# Patient Record
Sex: Male | Born: 1965 | Race: White | Hispanic: No | Marital: Single | Smoking: Never smoker
Health system: Southern US, Community
[De-identification: ages and names within clinical notes are randomized; demographics above are authoritative.]

## PROBLEM LIST (undated history)

## (undated) HISTORY — PX: NO PAST SURGERIES: SHX2092

---

## 2003-12-18 ENCOUNTER — Encounter: Admission: RE | Admit: 2003-12-18 | Discharge: 2003-12-18 | Payer: Self-pay | Admitting: Family Medicine

## 2004-10-17 ENCOUNTER — Ambulatory Visit: Payer: Self-pay | Admitting: Family Medicine

## 2004-11-12 ENCOUNTER — Emergency Department (HOSPITAL_COMMUNITY): Admission: EM | Admit: 2004-11-12 | Discharge: 2004-11-12 | Payer: Self-pay

## 2005-07-11 IMAGING — US US ABDOMEN COMPLETE
1 series · 14 of 25 positions shown · non-contrast
Comparison: none

CLINICAL DATA: Abdominal pain for several weeks.  
 COMPLETE ABDOMINAL ULTRASOUND
 Multiple scans of the entire abdomen show the gallbladder to be normal.  The common bile duct is normal and measures 2.8 mm in maximum diameter.  The liver appears normal.  The pancreas is not well seen due to overlying gas.  The spleen is normal and measures 11.5 cm.  The right kidney is normal and measures 12.5 cm.  The left kidney is normal and measures 12.3 cm.  The abdominal aorta is normal and measures 1.5 cm.  The inferior vena cava appears to be within the normal limit.
 There is no free fluid within the abdomen or the pleural spaces.  
 IMPRESSION
 Normal complete abdominal ultrasound with poor visualization of the pancreas due to overlying gas.

[Series 1: unknown · 0.32mm/px · 14 of 62 slices shown]
[im 1/62]
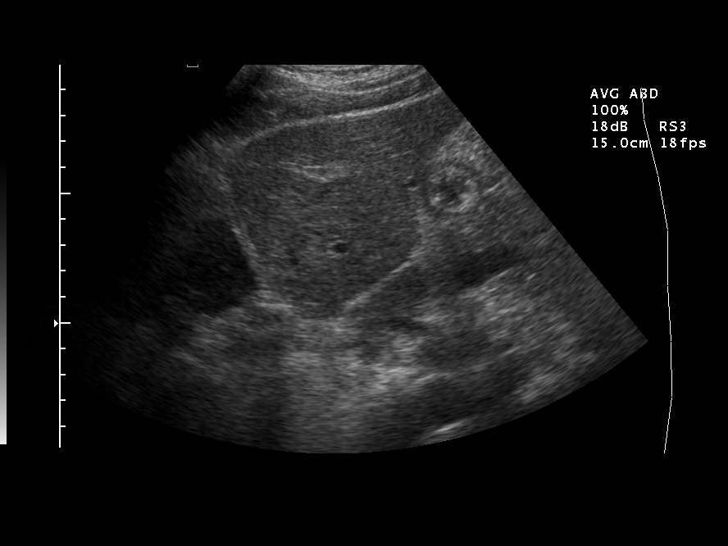
[im 6/62]
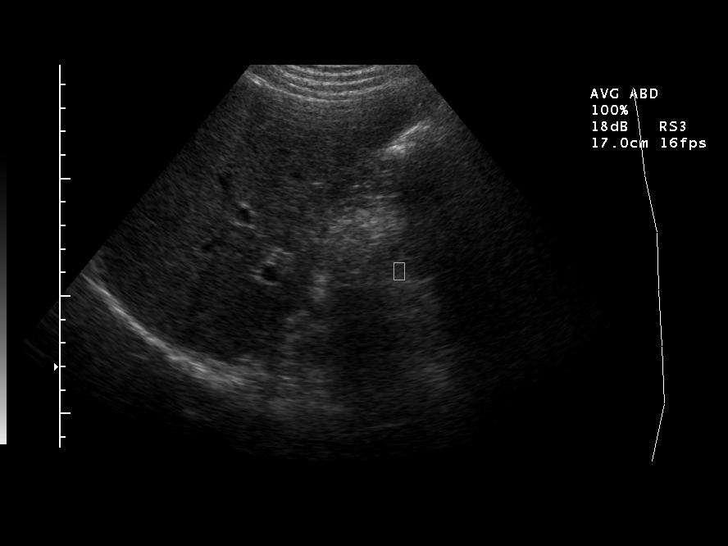
[im 11/62]
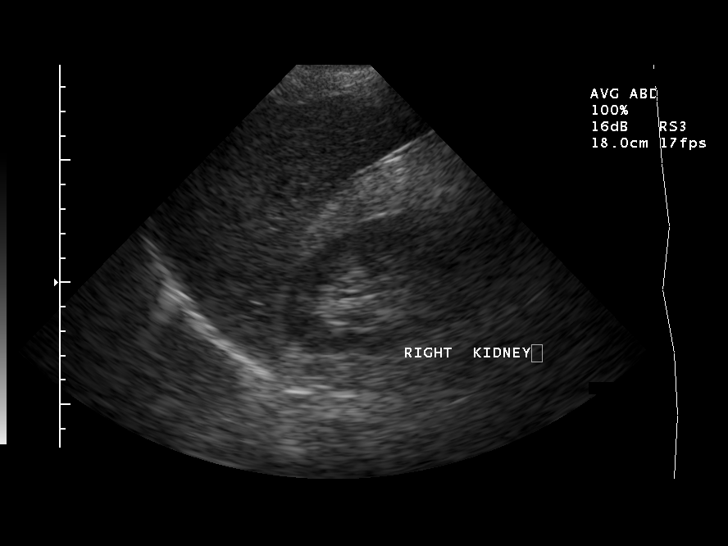
[im 16/62]
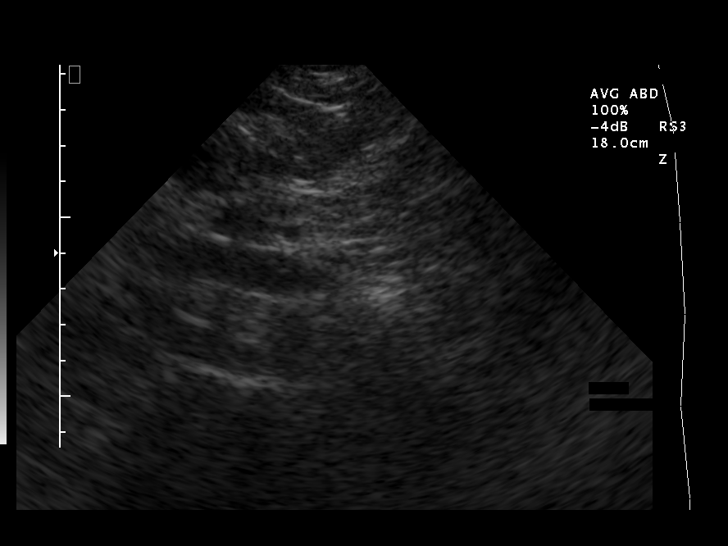
[im 21/62]
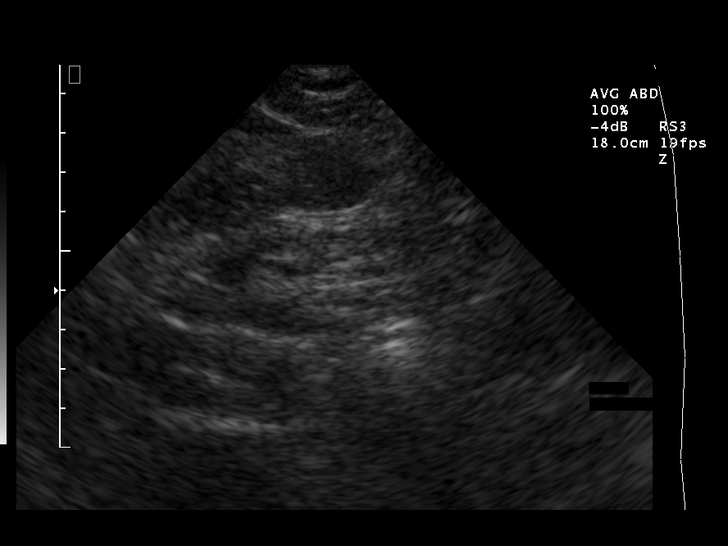
[im 23/62]
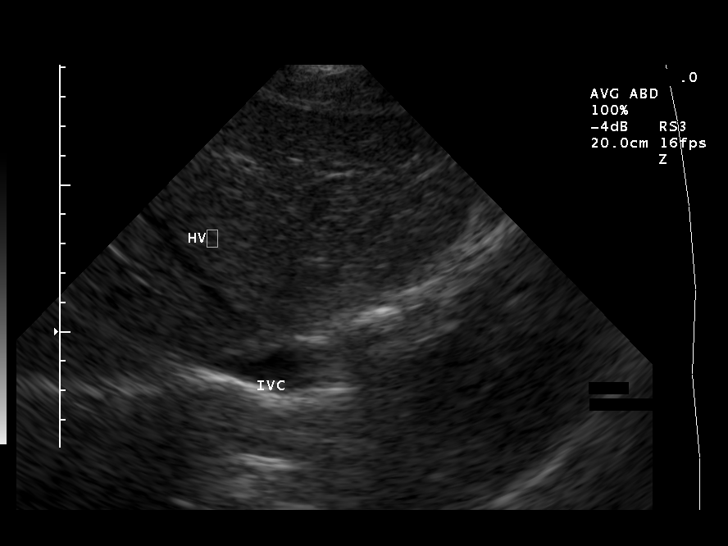
[im 28/62]
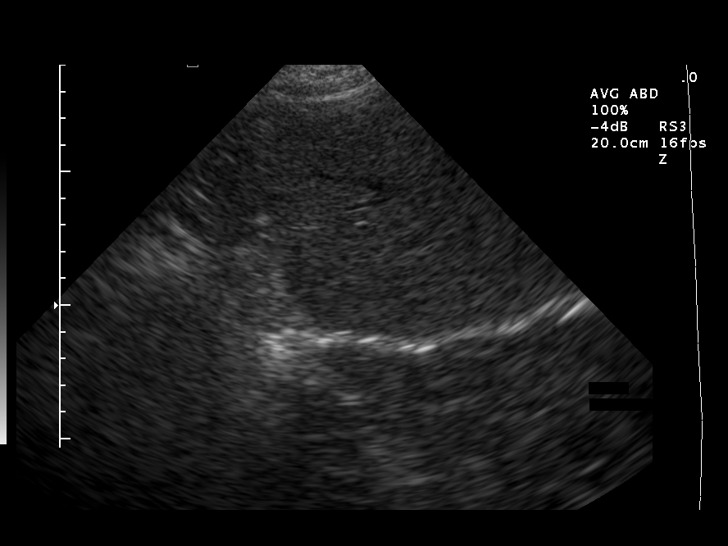
[im 34/62]
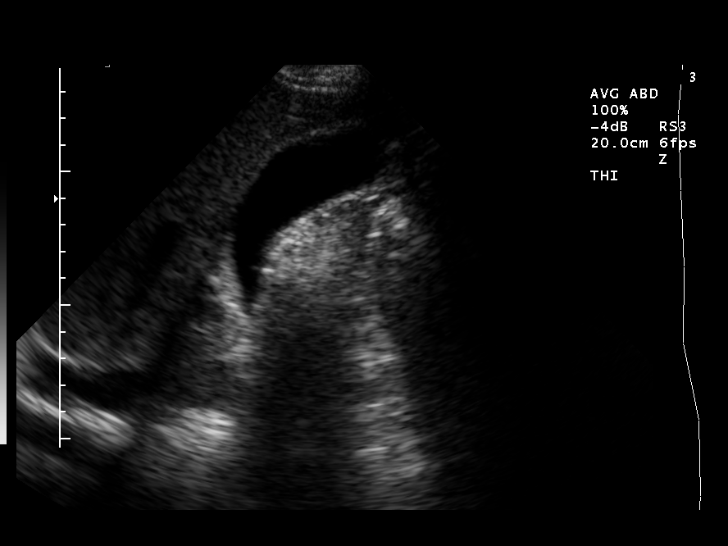
[im 39/62]
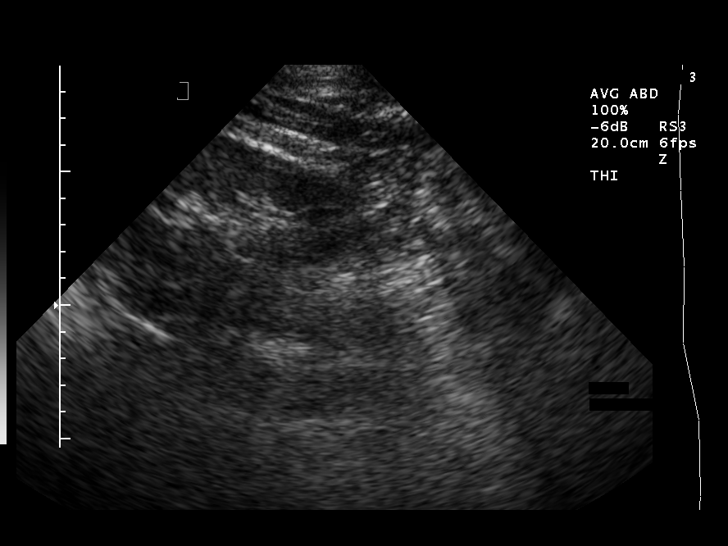
[im 41/62]
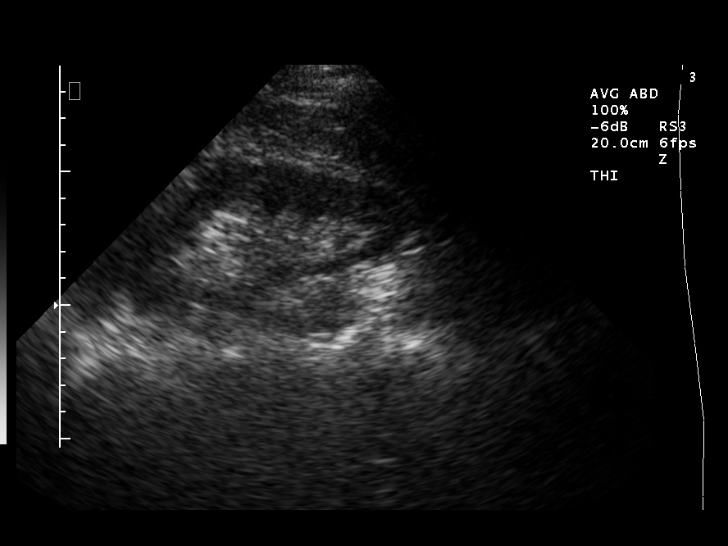
[im 46/62]
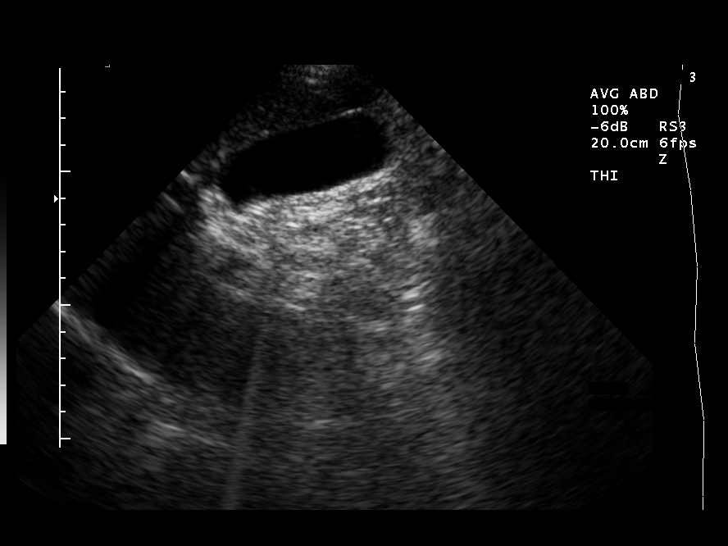
[im 51/62]
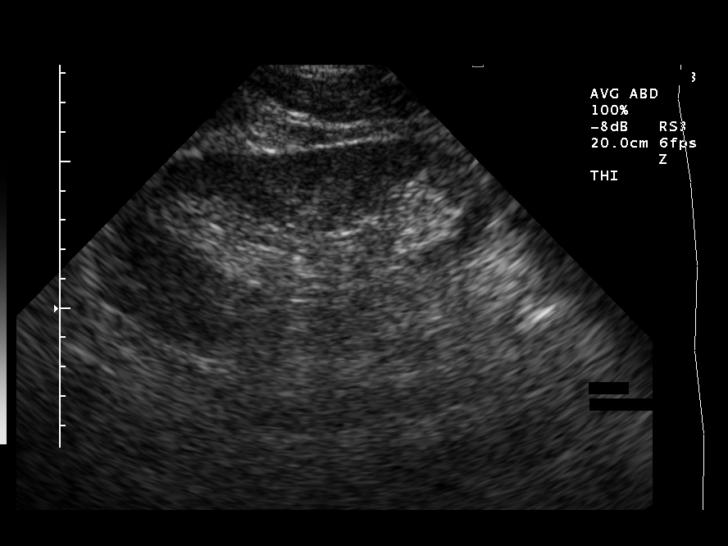
[im 56/62]
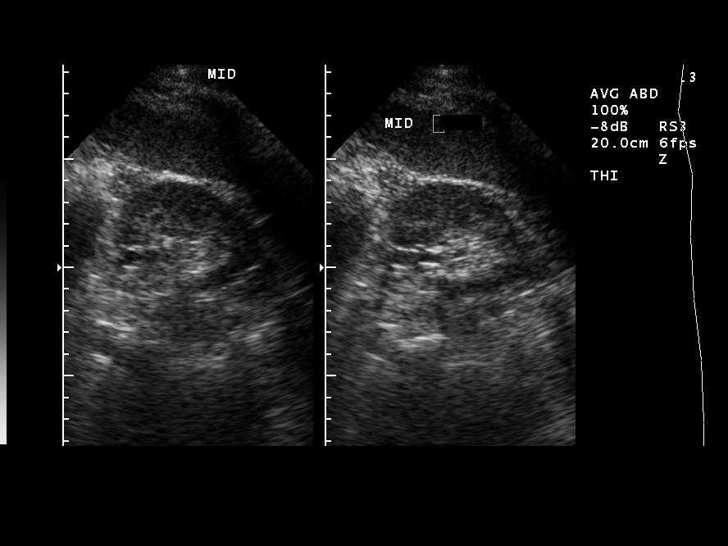
[im 62/62]
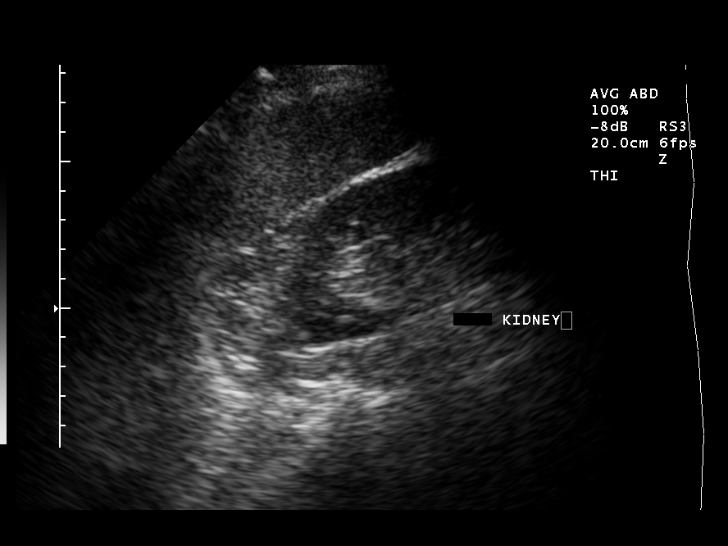

[14 of 25 positions shown; findings below may reference images not displayed]

## 2006-01-05 ENCOUNTER — Ambulatory Visit: Payer: Self-pay | Admitting: Internal Medicine

## 2006-01-09 ENCOUNTER — Ambulatory Visit: Payer: Self-pay | Admitting: Family Medicine

## 2006-06-06 IMAGING — CR DG CHEST 2V
2 series · 2 of 2 positions shown · non-contrast
Comparison: None.

CLINICAL DATA: Cough/congestion/sore throat. 
 CHEST - TWO VIEW:

[view not recorded (1 of 2)]
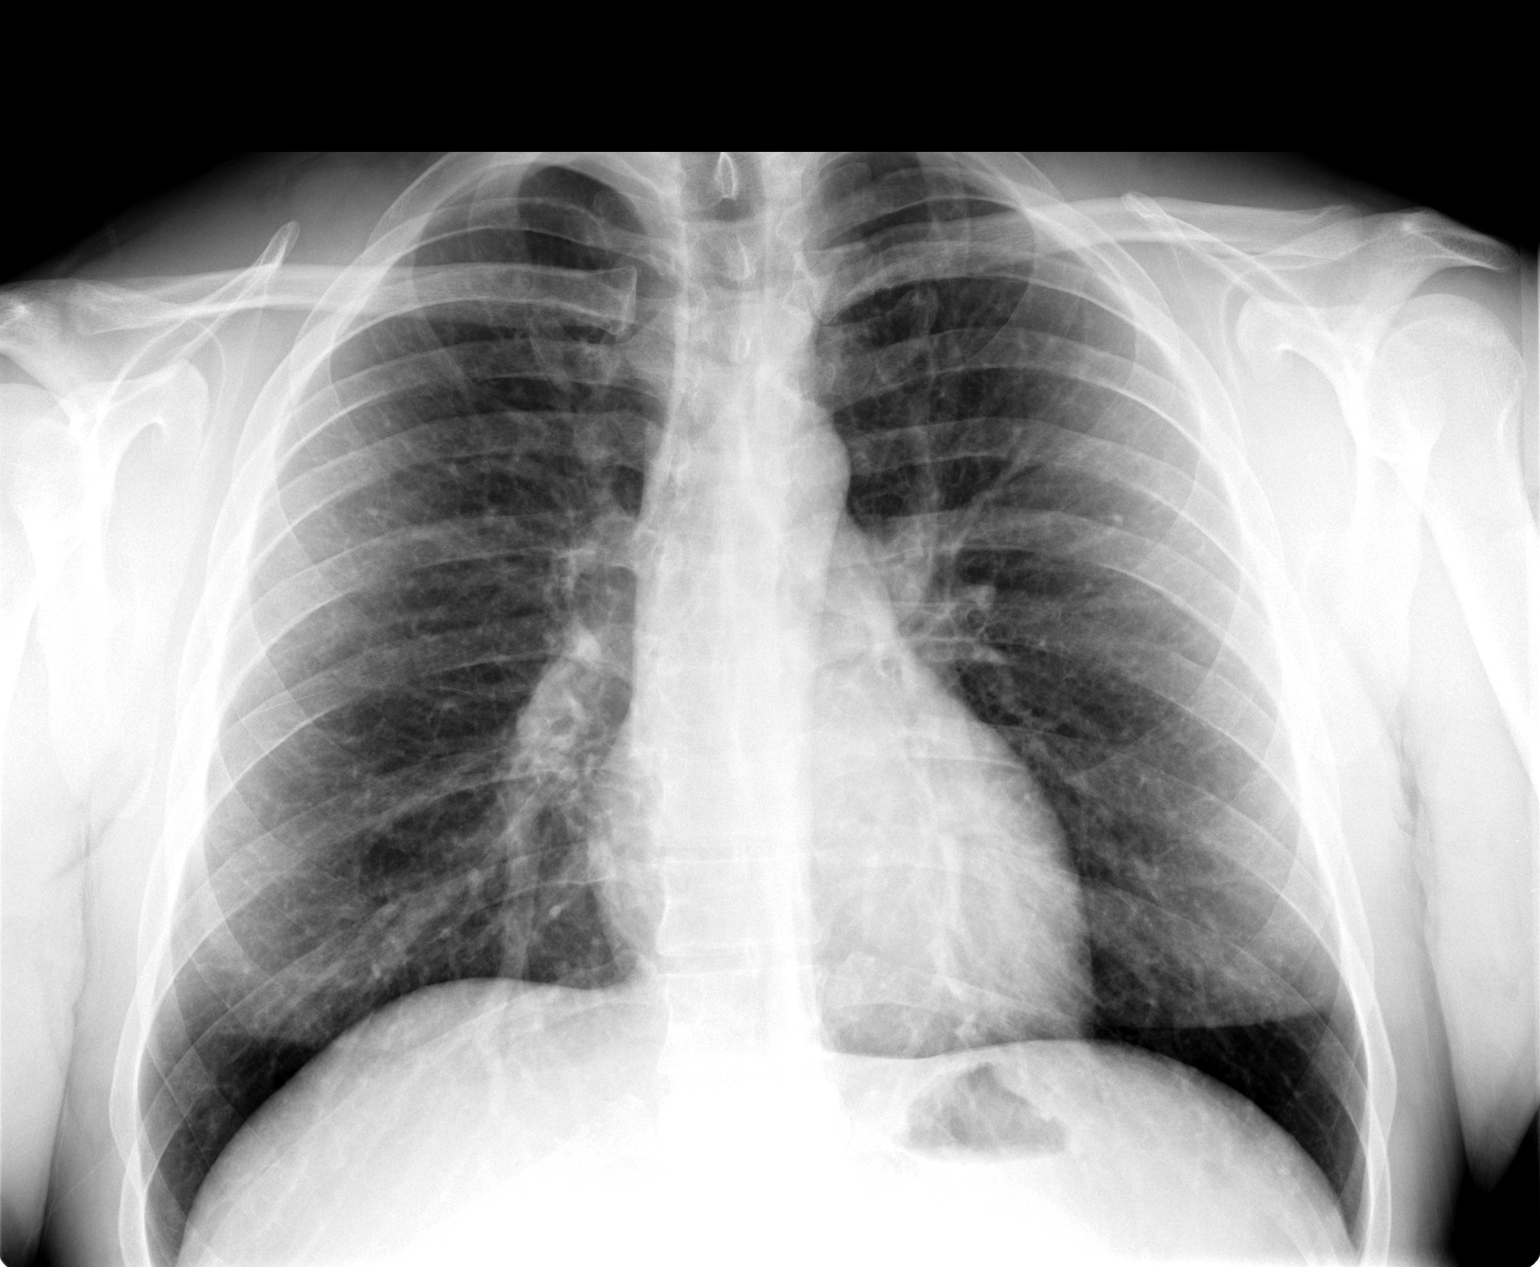

[view not recorded (2 of 2)]
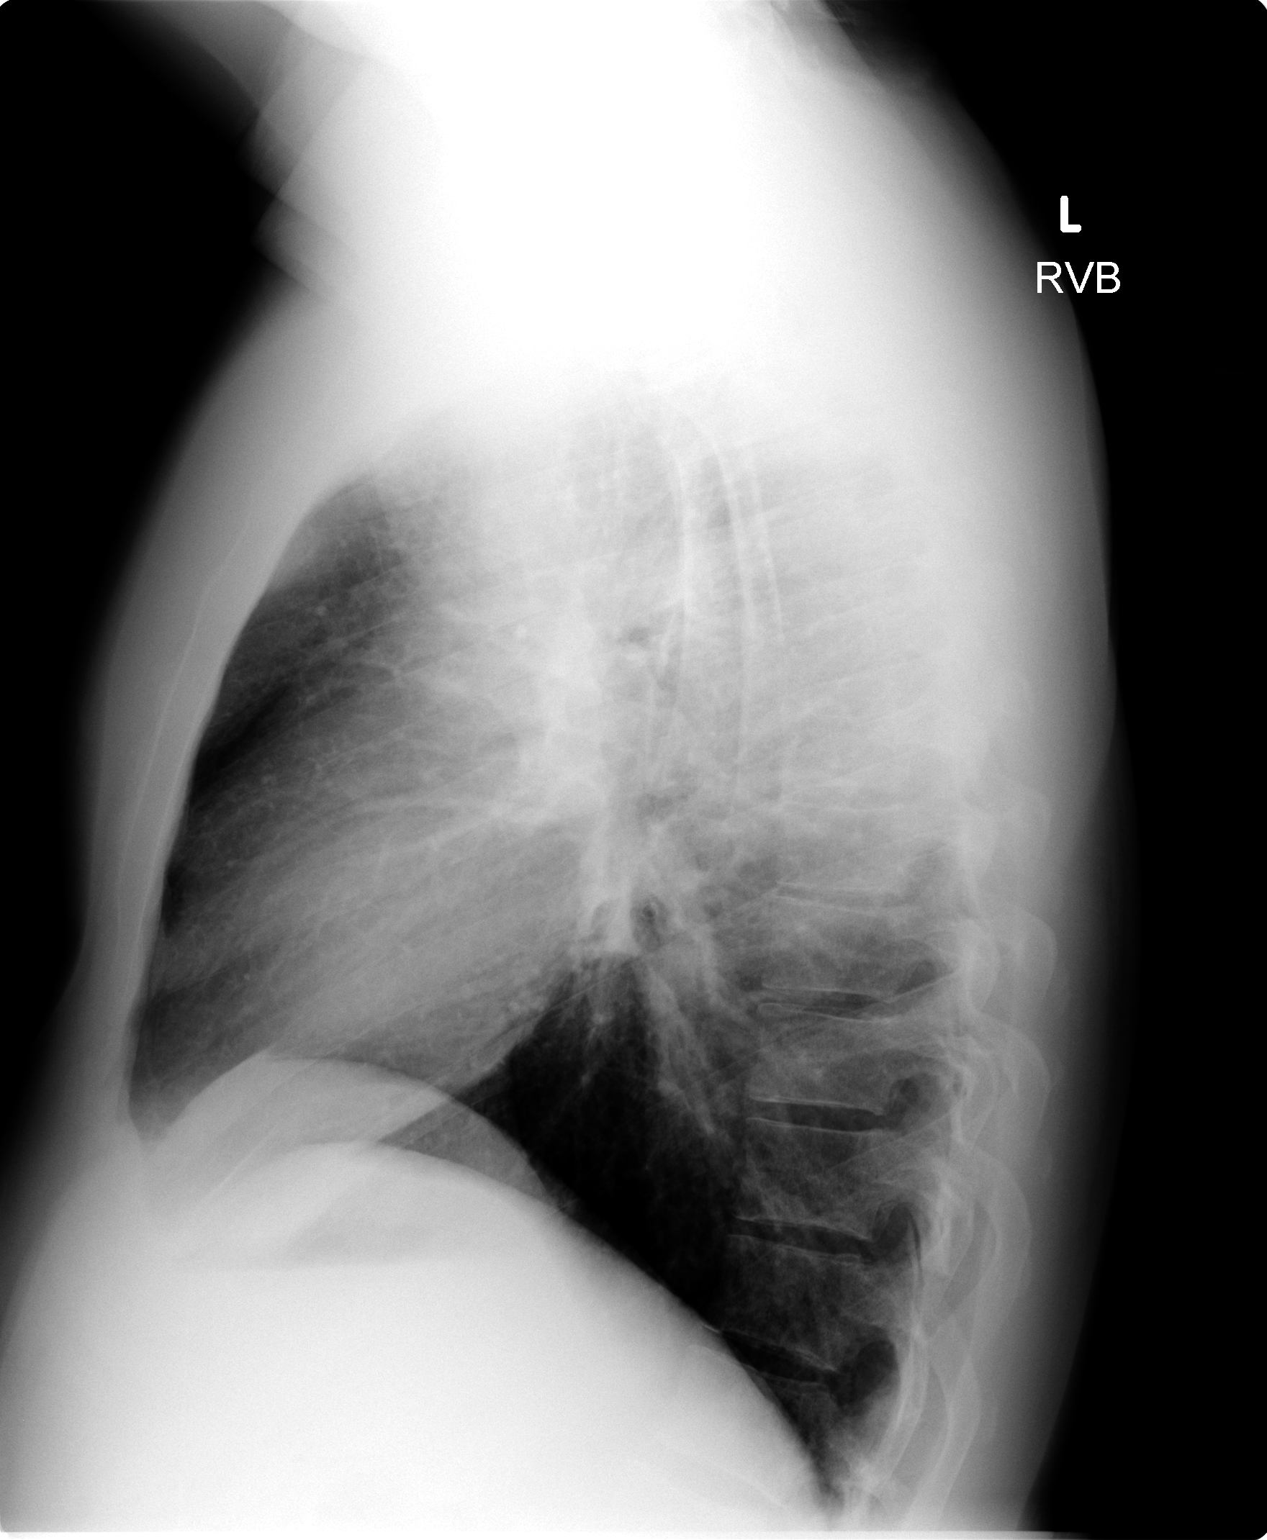

[2 of 2 positions shown; findings below may reference images not displayed]

FINDINGS: Heart and vascularity normal.  Peribronchial thickening without active pneumonia or pleural effusion.  Osseous structures intact.
IMPRESSION: Peribronchial thickening ? no active disease.

## 2006-07-24 ENCOUNTER — Ambulatory Visit: Payer: Self-pay | Admitting: Internal Medicine

## 2007-01-01 ENCOUNTER — Ambulatory Visit: Payer: Self-pay | Admitting: Family Medicine

## 2010-01-17 ENCOUNTER — Ambulatory Visit: Payer: Self-pay | Admitting: Family Medicine

## 2010-01-17 DIAGNOSIS — N410 Acute prostatitis: Secondary | ICD-10-CM

## 2010-01-17 DIAGNOSIS — R350 Frequency of micturition: Secondary | ICD-10-CM

## 2010-01-17 LAB — CONVERTED CEMR LAB
Bilirubin Urine: NEGATIVE
Blood Glucose, Fingerstick: 74
Glucose, Urine, Semiquant: NEGATIVE
Ketones, urine, test strip: NEGATIVE
Nitrite: NEGATIVE
Specific Gravity, Urine: 1.015
Urobilinogen, UA: 0.2
WBC Urine, dipstick: NEGATIVE
pH: 6

## 2010-02-21 ENCOUNTER — Ambulatory Visit: Payer: Self-pay | Admitting: Family Medicine

## 2010-02-21 LAB — CONVERTED CEMR LAB
Bilirubin Urine: NEGATIVE
Blood in Urine, dipstick: NEGATIVE
Glucose, Urine, Semiquant: NEGATIVE
Ketones, urine, test strip: NEGATIVE
Nitrite: NEGATIVE
Protein, U semiquant: NEGATIVE
Specific Gravity, Urine: 1.02
Urobilinogen, UA: 0.2
WBC Urine, dipstick: NEGATIVE
pH: 6.5

## 2010-12-20 NOTE — Assessment & Plan Note (Signed)
Summary: MUSCLE SPASMS/FREQ URINATION/DRY MOUTH/CJR   Vital Signs:  Patient profile:   45 year old male Height:      69 inches Weight:      180 pounds BMI:     26.68 Temp:     98.3 degrees F oral BP sitting:   130 / 80  (left arm) Cuff size:   regular  Vitals Entered By: Kern Reap CMA Duncan Dull) (January 17, 2010 3:50 PM)  History of Present Illness: Mike Lowe is a 45 year old single male, nonsmoker, who comes in today for evaluation of dry mouth, frequent urination, weight loss than weight gain, and muscle twitching.  For about 6 weeks.  He noted increased urination.  He stained, about 12 pounds in the past 6 weeks because he is not exercising as vigorously as he was before.  No family history of diabetes, and no nocturia.  He does consume a fair amount of caffeine.  He's also noticed some twitching in his chest muscles in his orbital muscles are coming down.  Nonfasting blood sugar 74  Preventive Screening-Counseling & Management  Alcohol-Tobacco     Smoking Status: never  Allergies (verified): No Known Drug Allergies  Past History:  Past medical, surgical, family and social histories (including risk factors) reviewed, and no changes noted (except as noted below).  Past Medical History: Unremarkable  Family History: Reviewed history and no changes required. Father: HTN deceased - cancer - lung Mother: healthy Siblings: 2 sisters - healthy  Social History: Reviewed history and no changes required. Occupation: Art therapist Single Never Smoked Alcohol use-no Smoking Status:  never  Review of Systems      See HPI  Physical Exam  General:  Well-developed,well-nourished,in no acute distress; alert,appropriate and cooperative throughout examination Genitalia:  Testes bilaterally descended without nodularity, tenderness or masses. No scrotal masses or lesions. No penis lesions or urethral discharge.   Impression & Recommendations:  Problem # 1:  ACUTE  PROSTATITIS (ICD-601.0) Assessment New  Complete Medication List: 1)  Doxycycline Hyclate 100 Mg Caps (Doxycycline hyclate) .... Take 1 tablet by mouth two times a day  Other Orders: Fingerstick (36416) Glucose, (CBG) (45409) UA Dipstick w/o Micro (manual) (81191)  Patient Instructions: 1)  stop all caffeinated beverages, drink, 30 ounces of water daily, begin doxycycline one twice a day, return in one month for follow-up Prescriptions: DOXYCYCLINE HYCLATE 100 MG CAPS (DOXYCYCLINE HYCLATE) Take 1 tablet by mouth two times a day  #60 x 1   Entered and Authorized by:   Roderick Pee MD   Signed by:   Roderick Pee MD on 01/17/2010   Method used:   Electronically to        [facility]  [address] [phone][facility] (retail)       [address] [place], [place]  [address]       Ph: [phone] or [phone]       Fax: [phone]   RxID:   [id number]   Laboratory Results   Urine Tests  Date/Time Received: January 17, 2010   Routine Urinalysis   Color: yellow Appearance: Clear Glucose: negative   (Normal Range: Negative) Bilirubin: negative   (Normal Range: Negative) Ketone: negative   (Normal Range: Negative) Spec. Gravity: 1.015   (Normal Range: 1.003-1.035) Blood: moderate   (Normal Range: Negative) pH: 6.0   (Normal Range: 5.0-8.0) Protein: trace   (Normal Range: Negative) Urobilinogen: 0.2   (Normal Range: 0-1) Nitrite:  negative   (Normal Range: Negative) Leukocyte Esterace: negative   (Normal Range: Negative)    Comments: Kern Reap CMA Duncan Dull)  January 17, 2010 4:02 PM   Blood Tests   Date/Time Received: January 17, 2010   CBG Random:: 74mg /dL  Comments: Kern Reap CMA Duncan Dull)  January 17, 2010 4:00 PM      Appended Document: MUSCLE SPASMS/FREQ URINATION/DRY MOUTH/CJR Called to ask about BS value.

## 2010-12-20 NOTE — Assessment & Plan Note (Signed)
Summary: 1 MONTH ROV/NJR/PT RSC/CJR   Vital Signs:  Patient profile:   45 year old male Weight:      181 pounds Temp:     98.2 degrees F oral BP sitting:   110 / 80  (left arm) Cuff size:   regular  Vitals Entered By: Mike Lowe CMA Duncan Dull) (February 21, 2010 4:23 PM) CC: follow-up visit   CC:  follow-up visit.  History of Present Illness: Mike Lowe is a 45 year old male, who comes in today for reevaluation of hematuria and prostatitis.  We saw him 5 weeks ago with symptoms consistent with prostatitis.  Urinalysis showed blood in his urine.  He took doxycycline b.i.d. for 4 weeks and is here for follow-up.  He states his symptoms have abated.  Urinalysis normal.  He also has a history of severe allergic rhinitis.  Its usually worse in the fall.  He's having a lot of symptoms now despite taking Zyrtec nightly.  Advised he could double up and take Claritin plain in the morning and Zyrtec at bedtime.  If, however, symptoms, and is miserable, call, and we will put him on a short course of prednisone  Allergies: No Known Drug Allergies  Past History:  Past medical, surgical, family and social histories (including risk factors) reviewed for relevance to current acute and chronic problems.  Past Medical History: Reviewed history from 01/17/2010 and no changes required. Unremarkable  Family History: Reviewed history from 01/17/2010 and no changes required. Father: HTN deceased - cancer - lung Mother: healthy Siblings: 2 sisters - healthy  Social History: Reviewed history from 01/17/2010 and no changes required. Occupation: Art therapist Single Never Smoked Alcohol use-no  Review of Systems      See HPI  Physical Exam  General:  Well-developed,well-nourished,in no acute distress; alert,appropriate and cooperative throughout examination   Impression & Recommendations:  Problem # 1:  ACUTE PROSTATITIS (ICD-601.0) Assessment Improved  Complete Medication List: 1)   Doxycycline Hyclate 100 Mg Caps (Doxycycline hyclate) .... Take 1 tablet by mouth two times a day  Other Orders: UA Dipstick w/o Micro (manual) (45409)  Patient Instructions: 1)  drink 30 ounces of water daily.  Return p.r.n.  Laboratory Results   Urine Tests  Date/Time Received: February 21, 2010   Routine Urinalysis   Color: yellow Appearance: Clear Glucose: negative   (Normal Range: Negative) Bilirubin: negative   (Normal Range: Negative) Ketone: negative   (Normal Range: Negative) Spec. Gravity: 1.020   (Normal Range: 1.003-1.035) Blood: negative   (Normal Range: Negative) pH: 6.5   (Normal Range: 5.0-8.0) Protein: negative   (Normal Range: Negative) Urobilinogen: 0.2   (Normal Range: 0-1) Nitrite: negative   (Normal Range: Negative) Leukocyte Esterace: negative   (Normal Range: Negative)    Comments: Mike Lowe CMA Duncan Dull)  February 21, 2010 4:51 PM

## 2012-07-30 ENCOUNTER — Encounter: Payer: Self-pay | Admitting: Family Medicine

## 2012-07-30 ENCOUNTER — Other Ambulatory Visit: Payer: Self-pay | Admitting: Family Medicine

## 2012-07-30 ENCOUNTER — Ambulatory Visit (INDEPENDENT_AMBULATORY_CARE_PROVIDER_SITE_OTHER): Payer: 59 | Admitting: Family Medicine

## 2012-07-30 VITALS — BP 140/90 | Temp 98.1°F | Ht 69.0 in | Wt 181.0 lb

## 2012-07-30 DIAGNOSIS — D235 Other benign neoplasm of skin of trunk: Secondary | ICD-10-CM

## 2012-07-30 NOTE — Patient Instructions (Signed)
Removed the Band-Aids tomorrow within 2 weeks we will call you the report

## 2012-07-30 NOTE — Progress Notes (Signed)
  Subjective:    Patient ID: Mike Lowe, male    DOB: 1966/09/14, 46 y.o.   MRN: [id number]  HPI Mike Lowe is a 46 year old married male nonsmoker who comes in today for evaluation of an abnormal lesion on his left foot  A couple years ago we removed some lesions they were benign we don't have the path report here we will request his old chart  He has extremely light skin and thousands of freckles. He is of [place] extraction grew up in [place] and spent a lot of time at [employer] when he was a teenager.  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 The bases were cauterized Band-Aids were applied  The lesion that he sees on his toe is brown no black pigment       Assessment & Plan:  Dysplastic nevi clinically path pending rule out melanoma

## 2013-04-11 ENCOUNTER — Encounter: Payer: Self-pay | Admitting: Internal Medicine

## 2013-04-11 ENCOUNTER — Ambulatory Visit (INDEPENDENT_AMBULATORY_CARE_PROVIDER_SITE_OTHER): Payer: 59 | Admitting: Internal Medicine

## 2013-04-11 VITALS — BP 140/82 | HR 76 | Temp 98.8°F | Resp 20 | Wt 184.0 lb

## 2013-04-11 DIAGNOSIS — J069 Acute upper respiratory infection, unspecified: Secondary | ICD-10-CM

## 2013-04-11 MED ORDER — AZITHROMYCIN 250 MG PO TABS: ORAL_TABLET | ORAL | Status: DC

## 2013-04-11 NOTE — Progress Notes (Signed)
Subjective:    Patient ID: Mike Lowe, male    DOB: [birth year], 47 y.o.   MRN: [id number]  HPI   47 year old who is seen for evaluation of an acute febrile illness. He had the onset of fever chills and chest congestion 6 days ago. He has had associated diaphoresis. The right has been as high as 101. Cough has been productive of yellow sputum. For the past 24 hours his cough fever and chills have all improved. 8 days ago he flew back from the [place] from a fishing trip  History reviewed. No pertinent past medical history.  History   Social History  . Marital Status: Married    Spouse Name: N/A    Number of Children: N/A  . Years of Education: N/A   Occupational History  . Not on file.   Social History Main Topics  . Smoking status: Never Smoker   . Smokeless tobacco: Not on file  . Alcohol Use: Not on file  . Drug Use: Not on file  . Sexually Active: Not on file   Other Topics Concern  . Not on file   Social History Narrative  . No narrative on file    History reviewed. No pertinent past surgical history.  No family history on file.  No Known Allergies  No current outpatient prescriptions on file prior to visit.   No current facility-administered medications on file prior to visit.    BP 140/82  Pulse 76  Temp(Src) 98.8 F (37.1 C) (Oral)  Resp 20  Wt 184 lb (83.462 kg)  BMI 27.16 kg/m2  SpO2 98%      Review of Systems  Constitutional: Positive for fever, chills, diaphoresis, activity change, appetite change and fatigue.  HENT: Negative for hearing loss, ear pain, congestion, sore throat, trouble swallowing, neck stiffness, dental problem, voice change and tinnitus.   Eyes: Negative for pain, discharge and visual disturbance.  Respiratory: Positive for cough. Negative for chest tightness, wheezing and stridor.   Cardiovascular: Negative for chest pain, palpitations and leg swelling.  Gastrointestinal: Negative for nausea, vomiting, abdominal pain,  diarrhea, constipation, blood in stool and abdominal distention.  Genitourinary: Negative for urgency, hematuria, flank pain, discharge, difficulty urinating and genital sores.  Musculoskeletal: Negative for myalgias, back pain, joint swelling, arthralgias and gait problem.  Skin: Negative for rash.  Neurological: Negative for dizziness, syncope, speech difficulty, weakness, numbness and headaches.  Hematological: Negative for adenopathy. Does not bruise/bleed easily.  Psychiatric/Behavioral: Negative for behavioral problems and dysphoric mood. The patient is not nervous/anxious.        Objective:   Physical Exam  Constitutional: He is oriented to person, place, and time. He appears well-developed.  HENT:  Head: Normocephalic.  Right Ear: External ear normal.  Left Ear: External ear normal.  Eyes: Conjunctivae and EOM are normal.  Neck: Normal range of motion.  Cardiovascular: Normal rate and normal heart sounds.   Pulmonary/Chest: Effort normal.  Scattered coarse rhonchi left greater than the right  Abdominal: Bowel sounds are normal.  Musculoskeletal: Normal range of motion. He exhibits no edema and no tenderness.  Neurological: He is alert and oriented to person, place, and time.  Psychiatric: He has a normal mood and affect. His behavior is normal.          Assessment & Plan:    acute viral URI with cough. Patient seems to be clinically improving. We'll continue expectorants.  Was given a prescription for azithromycin- unlikely he will need to be filled if  symptoms relapse over the long holiday weekend

## 2013-04-11 NOTE — Patient Instructions (Signed)
Acute bronchitis symptoms for less than 10 days are generally not helped by antibiotics.  Take over-the-counter expectorants and cough medications such as  Mucinex DM.  Call if there is no improvement in 5 to 7 days or if he developed worsening cough, fever, or new symptoms, such as shortness of breath or chest pain.    

## 2015-11-09 ENCOUNTER — Ambulatory Visit (INDEPENDENT_AMBULATORY_CARE_PROVIDER_SITE_OTHER): Payer: 59 | Admitting: Family Medicine

## 2015-11-09 ENCOUNTER — Encounter: Payer: Self-pay | Admitting: Family Medicine

## 2015-11-09 VITALS — BP 160/88 | HR 67 | Temp 97.9°F | Ht 69.0 in | Wt 181.3 lb

## 2015-11-09 DIAGNOSIS — J988 Other specified respiratory disorders: Principal | ICD-10-CM

## 2015-11-09 DIAGNOSIS — IMO0001 Reserved for inherently not codable concepts without codable children: Secondary | ICD-10-CM

## 2015-11-09 DIAGNOSIS — B349 Viral infection, unspecified: Secondary | ICD-10-CM

## 2015-11-09 DIAGNOSIS — B9789 Other viral agents as the cause of diseases classified elsewhere: Secondary | ICD-10-CM

## 2015-11-09 DIAGNOSIS — R030 Elevated blood-pressure reading, without diagnosis of hypertension: Secondary | ICD-10-CM

## 2015-11-09 NOTE — Progress Notes (Signed)
   HPI:  Sore throat: -started: several days ago -symptoms:nasal congestion, sore throat -denies:fever, SOB, NVD, tooth pain, diarrhea -has tried: nothing -sick contacts/travel/risks: denies flu exposure, tick exposure or or Ebola risks -Hx of: allergies -denies any concern for STIs  Elevated BP: -reports has [facility] -reports gets very anxious for office visits and always up at doctor visits but monitors at home an dis normal -reports PCP aware and no tx advised  ROS: See pertinent positives and negatives per HPI.  No past medical history on file.  No past surgical history on file.  No family history on file.  Social History   Social History  . Marital Status: Married    Spouse Name: N/A  . Number of Children: N/A  . Years of Education: N/A   Social History Main Topics  . Smoking status: Never Smoker   . Smokeless tobacco: None  . Alcohol Use: None  . Drug Use: None  . Sexual Activity: Not Asked   Other Topics Concern  . None   Social History Narrative     Current outpatient prescriptions:  .  Ascorbic Acid (VITAMIN C) 1000 MG tablet, Take 1,000 mg by mouth daily., Disp: , Rfl:   EXAM:  Filed Vitals:   11/09/15 1602  BP: 160/88  Pulse: 67  Temp: 97.9 F (36.6 C)    Body mass index is 26.76 kg/(m^2).  GENERAL: vitals reviewed and listed above, alert, oriented, appears well hydrated and in no acute distress  HEENT: atraumatic, conjunttiva clear, no obvious abnormalities on inspection of external nose and ears, normal appearance of ear canals and TMs, clear nasal congestion, mild post oropharyngeal erythema with small ulcer L soft palate with PND, no tonsillar edema or exudate, no sinus TTP  NECK: no obvious masses on inspection  LUNGS: clear to auscultation bilaterally, no wheezes, rales or rhonchi, good air movement  CV: HRRR, no peripheral edema  MS: moves all extremities without noticeable abnormality  PSYCH: pleasant and cooperative, no obvious  depression or anxiety  ASSESSMENT AND PLAN:  Discussed the following assessment and plan:  Viral respiratory infection  Elevated BP  -given HPI and exam findings today, a serious infection or illness is unlikely. We discussed potential etiologies, with VURI being most likely, and advised supportive care and monitoring. We discussed treatment side effects, likely course, antibiotic misuse, transmission, and signs of developing a serious illness. -discussed supportive therapy for ulcer and advised follow up if persists -advised monitor home BP and follow up with PCP if elevated BP  -of course, we advised to return or notify a doctor immediately if symptoms worsen or persist or new concerns arise.    There are no Patient Instructions on file for this visit.   Colin Benton R.

## 2015-11-09 NOTE — Progress Notes (Signed)
Pre visit review using our clinic review tool, if applicable. No additional management support is needed unless otherwise documented below in the visit note. 

## 2019-10-24 ENCOUNTER — Telehealth: Payer: 59 | Admitting: Adult Health

## 2019-10-24 ENCOUNTER — Other Ambulatory Visit: Payer: Self-pay

## 2022-08-14 ENCOUNTER — Emergency Department (HOSPITAL_BASED_OUTPATIENT_CLINIC_OR_DEPARTMENT_OTHER)
Admission: EM | Admit: 2022-08-14 | Discharge: 2022-08-14 | Disposition: A | Discharge status: Home / Self Care | Payer: 59 | Attending: Emergency Medicine | Admitting: Emergency Medicine

## 2022-08-14 ENCOUNTER — Encounter (HOSPITAL_BASED_OUTPATIENT_CLINIC_OR_DEPARTMENT_OTHER): Payer: Self-pay | Admitting: Emergency Medicine

## 2022-08-14 ENCOUNTER — Other Ambulatory Visit: Payer: Self-pay

## 2022-08-14 ENCOUNTER — Other Ambulatory Visit (HOSPITAL_BASED_OUTPATIENT_CLINIC_OR_DEPARTMENT_OTHER): Payer: Self-pay

## 2022-08-14 DIAGNOSIS — L02512 Cutaneous abscess of left hand: Secondary | ICD-10-CM

## 2022-08-14 DIAGNOSIS — L03114 Cellulitis of left upper limb: Secondary | ICD-10-CM | POA: Insufficient documentation

## 2022-08-14 MED ORDER — DOXYCYCLINE HYCLATE 100 MG PO CAPS
100.0000 mg | ORAL_CAPSULE | Freq: Two times a day (BID) | ORAL | 0 refills | Status: DC
  Filled 2022-08-14: qty 20, 10d supply, fill #0

## 2022-08-14 MED ORDER — LIDOCAINE-EPINEPHRINE 2 %-1:200000 IJ SOLN
10.0000 mL | Freq: Once | INTRAMUSCULAR | Status: AC
Start: 2022-08-14 — End: 2022-08-14
  Administered 2022-08-14: 10 mL via INTRADERMAL
  Filled 2022-08-14: qty 20

## 2022-08-14 MED ORDER — DOXYCYCLINE HYCLATE 100 MG PO TABS
100.0000 mg | ORAL_TABLET | Freq: Once | ORAL | Status: AC
  Administered 2022-08-14: 100 mg via ORAL
  Filled 2022-08-14: qty 1

## 2022-08-14 NOTE — ED Triage Notes (Signed)
Pt reports injury to finger x2 weeks ago and now infection. Was treated with bactrim at clinic in [facility] on 9/21 w/o relief.

## 2022-08-14 NOTE — ED Provider Notes (Signed)
[facility] EMERGENCY DEPT Provider Note   CSN: [id number] Arrival date & time: 08/14/22  1316     History  Chief Complaint  Patient presents with   Finger Infection    Mike Lowe is a 56 y.o. male.  56 yo M with a chief complaints of finger pain.  The patient says been going on for about a week.  He noticed a small blister there and then has gotten progressively worse.  He went to a walk-in clinic and was started on Bactrim.  Has been on that for about 5 days without significant improvement.  Has tried warm soaks that also have not seemed to help much.  He has no issue bending the finger.  Has felt warm at times but denies any fevers.  No known fb.        Home Medications Prior to Admission medications   Medication Sig Start Date End Date Taking? Authorizing Provider  doxycycline (VIBRAMYCIN) 100 MG capsule Take 1 capsule (100 mg total) by mouth 2 (two) times daily. 08/14/22  Yes Deno Etienne, DO  Ascorbic Acid (VITAMIN C) 1000 MG tablet Take 1,000 mg by mouth daily.    [provider]      Allergies    Patient has no known allergies.    Review of Systems   Review of Systems  Physical Exam Updated Vital Signs BP (!) 136/95   Pulse 69   Temp 98.6 F (37 C) (Temporal)   Resp 16   Ht '5\' 9"'$  (1.753 m)   Wt 73.9 kg   SpO2 96%   BMI 24.07 kg/m  Physical Exam Vitals and nursing note reviewed.  Constitutional:      Appearance: He is well-developed.  HENT:     Head: Normocephalic and atraumatic.  Eyes:     Pupils: Pupils are equal, round, and reactive to light.  Neck:     Vascular: No JVD.  Cardiovascular:     Rate and Rhythm: Normal rate and regular rhythm.     Heart sounds: No murmur heard.    No friction rub. No gallop.  Pulmonary:     Effort: No respiratory distress.     Breath sounds: No wheezing.  Abdominal:     General: There is no distension.     Tenderness: There is no abdominal tenderness. There is no guarding or rebound.   Musculoskeletal:        General: Normal range of motion.     Cervical back: Normal range of motion and neck supple.     Comments: Patient with some fluctuance and purulent drainage to the tip of the left second digit.  Small amounts of blistering.  No obvious pain along the flexor tendon sheath.  No obvious paronychia.  Skin:    Coloration: Skin is not pale.     Findings: No rash.  Neurological:     Mental Status: He is alert and oriented to person, place, and time.  Psychiatric:        Behavior: Behavior normal.     ED Results / Procedures / Treatments   Labs (all labs ordered are listed, but only abnormal results are displayed) Labs Reviewed - No data to display  EKG None  Radiology No results found.  Procedures .Marland KitchenIncision and Drainage  Date/Time: 08/14/2022 3:34 PM  Performed by: Deno Etienne, DO Authorized by: Deno Etienne, DO   Consent:    Consent obtained:  Verbal   Consent given by:  Patient   Risks, benefits,  and alternatives were discussed: yes     Risks discussed:  Bleeding   Alternatives discussed:  No treatment Universal protocol:    Procedure explained and questions answered to patient or proxy's satisfaction: yes     Patient identity confirmed:  Verbally with patient Location:    Type:  Abscess   Location:  Upper extremity   Upper extremity location:  Finger   Finger location:  L index finger Pre-procedure details:    Skin preparation:  Chlorhexidine Sedation:    Sedation type:  None Anesthesia:    Anesthesia method:  Nerve block   Block location:  Digital block   Block needle gauge:  27 G   Block anesthetic:  Lidocaine 2% WITH epi   Block injection procedure:  Anatomic landmarks identified, anatomic landmarks palpated and introduced needle   Block outcome:  Anesthesia achieved Procedure type:    Complexity:  Complex Procedure details:    Ultrasound guidance: no     Needle aspiration: no     Incision types:  Single straight   Incision depth:   Subcutaneous   Wound management:  Probed and deloculated   Drainage:  Bloody and purulent   Drainage amount:  Moderate   Wound treatment:  Wound left open   Packing materials:  None Post-procedure details:    Procedure completion:  Tolerated well, no immediate complications     Medications Ordered in ED Medications  lidocaine-EPINEPHrine (XYLOCAINE W/EPI) 2 %-1:200000 (PF) injection 10 mL (10 mLs Intradermal Given 08/14/22 1510)  doxycycline (VIBRA-TABS) tablet 100 mg (100 mg Oral Given 08/14/22 1535)    ED Course/ Medical Decision Making/ A&P                           Medical Decision Making Risk Prescription drug management.   56 yo M with a chief complaints of concern for a fingertip infection.  Going on for a little bit over a week now.  Has been on Bactrim for about 5 days.  Clinically has infection, could be a felon.  Already open and draining.  We will perform I&D at bedside.  Give hand follow-up.  Change antibiotics.  On exploration of the finger no obvious extension into the pulp.  3:35 PM:  I have discussed the diagnosis/risks/treatment options with the patient.  Evaluation and diagnostic testing in the emergency department does not suggest an emergent condition requiring admission or immediate intervention beyond what has been performed at this time.  They will follow up with  Hand, PCP. We also discussed returning to the ED immediately if new or worsening sx occur. We discussed the sx which are most concerning (e.g., sudden worsening pain, fever, inability to tolerate by mouth) that necessitate immediate return. Medications administered to the patient during their visit and any new prescriptions provided to the patient are listed below.  Medications given during this visit Medications  lidocaine-EPINEPHrine (XYLOCAINE W/EPI) 2 %-1:200000 (PF) injection 10 mL (10 mLs Intradermal Given 08/14/22 1510)  doxycycline (VIBRA-TABS) tablet 100 mg (100 mg Oral Given 08/14/22 1535)      The patient appears reasonably screen and/or stabilized for discharge and I doubt any other medical condition or other [facility] requiring further screening, evaluation, or treatment in the ED at this time prior to discharge.          Final Clinical Impression(s) / ED Diagnoses Final diagnoses:  Abscess of finger of left hand    Rx / DC Orders ED Discharge Orders  Ordered    doxycycline (VIBRAMYCIN) 100 MG capsule  2 times daily        08/14/22 Justin, [place], DO 08/14/22 1535

## 2022-08-14 NOTE — Discharge Instructions (Signed)
Warm compresses at least 4 times a day.  Please return for rapid spreading redness or if you develop a fever.  Call the hand surgeon on call and schedule an appointment to be seen in the office.

## 2022-08-28 ENCOUNTER — Encounter (HOSPITAL_BASED_OUTPATIENT_CLINIC_OR_DEPARTMENT_OTHER): Payer: Self-pay | Admitting: Anesthesiology

## 2022-08-28 ENCOUNTER — Encounter (HOSPITAL_BASED_OUTPATIENT_CLINIC_OR_DEPARTMENT_OTHER)
Admission: RE | Disposition: A | Discharge status: Home / Self Care | Payer: Self-pay | Source: Home / Self Care | Attending: Orthopedic Surgery

## 2022-08-28 ENCOUNTER — Other Ambulatory Visit: Payer: Self-pay | Admitting: Orthopedic Surgery

## 2022-08-28 ENCOUNTER — Ambulatory Visit (HOSPITAL_BASED_OUTPATIENT_CLINIC_OR_DEPARTMENT_OTHER)
Admission: RE | Admit: 2022-08-28 | Discharge: 2022-08-28 | Disposition: A | Discharge status: Home / Self Care | Payer: 59 | Attending: Orthopedic Surgery | Admitting: Orthopedic Surgery

## 2022-08-28 ENCOUNTER — Ambulatory Visit (HOSPITAL_BASED_OUTPATIENT_CLINIC_OR_DEPARTMENT_OTHER): Payer: 59

## 2022-08-28 ENCOUNTER — Encounter (HOSPITAL_BASED_OUTPATIENT_CLINIC_OR_DEPARTMENT_OTHER): Payer: Self-pay | Admitting: Orthopedic Surgery

## 2022-08-28 DIAGNOSIS — S61241A Puncture wound with foreign body of left index finger without damage to nail, initial encounter: Secondary | ICD-10-CM | POA: Insufficient documentation

## 2022-08-28 DIAGNOSIS — L089 Local infection of the skin and subcutaneous tissue, unspecified: Secondary | ICD-10-CM | POA: Diagnosis present

## 2022-08-28 DIAGNOSIS — W458XXA Other foreign body or object entering through skin, initial encounter: Secondary | ICD-10-CM | POA: Diagnosis not present

## 2022-08-28 DIAGNOSIS — M869 Osteomyelitis, unspecified: Secondary | ICD-10-CM | POA: Diagnosis not present

## 2022-08-28 HISTORY — PX: MINOR IRRIGATION AND DEBRIDEMENT OF WOUND: SHX6239

## 2022-08-28 SURGERY — MINOR IRRIGATION AND DEBRIDEMENT OF WOUND
Anesthesia: LOCAL | Site: Finger | Laterality: Left

## 2022-08-28 MED ORDER — SULFAMETHOXAZOLE-TRIMETHOPRIM 800-160 MG PO TABS: 1.0000 | ORAL_TABLET | Freq: Two times a day (BID) | ORAL | 0 refills | Status: AC

## 2022-08-28 MED ORDER — BUPIVACAINE HCL (PF) 0.25 % IJ SOLN
INTRAMUSCULAR | Status: AC
  Filled 2022-08-28: qty 30

## 2022-08-28 MED ORDER — BUPIVACAINE HCL (PF) 0.25 % IJ SOLN
INTRAMUSCULAR | Status: DC | PRN
  Administered 2022-08-28: 10 mL

## 2022-08-28 MED ORDER — TRAMADOL HCL 50 MG PO TABS: ORAL_TABLET | ORAL | 0 refills | Status: AC

## 2022-08-28 SURGICAL SUPPLY — 55 items
APL PRP STRL LF DISP 70% ISPRP (MISCELLANEOUS)
BAG DECANTER FOR FLEXI CONT (MISCELLANEOUS) IMPLANT
BANDAGE GAUZE 1X75IN STRL (MISCELLANEOUS) IMPLANT
BLADE MINI RND TIP GREEN BEAV (BLADE) IMPLANT
BLADE SURG 15 STRL LF DISP TIS (BLADE) ×4 IMPLANT
BLADE SURG 15 STRL SS (BLADE) ×4
BNDG CMPR 75X11 PLY HI ABS (MISCELLANEOUS)
BNDG CMPR 9X4 STRL LF SNTH (GAUZE/BANDAGES/DRESSINGS)
BNDG COHESIVE 1X5 TAN STRL LF (GAUZE/BANDAGES/DRESSINGS) IMPLANT
BNDG ELASTIC 2X5.8 VLCR STR LF (GAUZE/BANDAGES/DRESSINGS) IMPLANT
BNDG ELASTIC 3X5.8 VLCR STR LF (GAUZE/BANDAGES/DRESSINGS) IMPLANT
BNDG ESMARK 4X9 LF (GAUZE/BANDAGES/DRESSINGS) IMPLANT
BNDG GAUZE 1X75IN STRL (MISCELLANEOUS)
BNDG GAUZE DERMACEA FLUFF 4 (GAUZE/BANDAGES/DRESSINGS) IMPLANT
BNDG GZE DERMACEA 4 6PLY (GAUZE/BANDAGES/DRESSINGS)
CHLORAPREP W/TINT 26 (MISCELLANEOUS) ×1 IMPLANT
CORD BIPOLAR FORCEPS 12FT (ELECTRODE) ×2 IMPLANT
COVER BACK TABLE 60X90IN (DRAPES) ×2 IMPLANT
COVER MAYO STAND STRL (DRAPES) ×2 IMPLANT
CUFF TOURN SGL QUICK 18X4 (TOURNIQUET CUFF) ×2 IMPLANT
DRAIN PENROSE 12X.25 LTX STRL (MISCELLANEOUS) ×1 IMPLANT
DRAPE C-ARMOR (DRAPES) ×1 IMPLANT
DRAPE EXTREMITY T 121X128X90 (DISPOSABLE) ×2 IMPLANT
DRAPE SURG 17X23 STRL (DRAPES) ×2 IMPLANT
GAUZE PACKING IODOFORM 1/4X15 (PACKING) ×1 IMPLANT
GAUZE SPONGE 4X4 12PLY STRL (GAUZE/BANDAGES/DRESSINGS) ×2 IMPLANT
GAUZE XEROFORM 1X8 LF (GAUZE/BANDAGES/DRESSINGS) ×2 IMPLANT
GLOVE BIO SURGEON STRL SZ7.5 (GLOVE) ×2 IMPLANT
GLOVE BIOGEL PI IND STRL 8 (GLOVE) ×2 IMPLANT
GOWN STRL REUS W/ TWL LRG LVL3 (GOWN DISPOSABLE) ×2 IMPLANT
GOWN STRL REUS W/TWL LRG LVL3 (GOWN DISPOSABLE) ×2
GOWN STRL REUS W/TWL XL LVL3 (GOWN DISPOSABLE) ×2 IMPLANT
LOOP VESSEL MAXI BLUE (MISCELLANEOUS) IMPLANT
NDL BLUNT 17GA (NEEDLE) IMPLANT
NDL HYPO 25X1 1.5 SAFETY (NEEDLE) IMPLANT
NEEDLE BLUNT 17GA (NEEDLE) IMPLANT
NEEDLE HYPO 25X1 1.5 SAFETY (NEEDLE) ×2 IMPLANT
NS IRRIG 1000ML POUR BTL (IV SOLUTION) ×2 IMPLANT
PACK BASIN DAY SURGERY FS (CUSTOM PROCEDURE TRAY) ×2 IMPLANT
PAD CAST 3X4 CTTN HI CHSV (CAST SUPPLIES) IMPLANT
PADDING CAST ABS COTTON 4X4 ST (CAST SUPPLIES) ×1 IMPLANT
PADDING CAST COTTON 3X4 STRL (CAST SUPPLIES)
SPLINT FINGER 4.25 BULB 911906 (SOFTGOODS) ×1 IMPLANT
SPLINT PLASTER CAST XFAST 3X15 (CAST SUPPLIES) IMPLANT
STOCKINETTE 4X48 STRL (DRAPES) ×1 IMPLANT
SUT ETHILON 4 0 PS 2 18 (SUTURE) ×1 IMPLANT
SWAB COLLECTION DEVICE MRSA (MISCELLANEOUS) ×1 IMPLANT
SWAB CULTURE ESWAB REG 1ML (MISCELLANEOUS) ×1 IMPLANT
SYR 20ML LL LF (SYRINGE) IMPLANT
SYR BULB EAR ULCER 3OZ GRN STR (SYRINGE) ×2 IMPLANT
SYR CONTROL 10ML LL (SYRINGE) ×1 IMPLANT
SYR TOOMEY 50ML (SYRINGE) IMPLANT
TOWEL GREEN STERILE FF (TOWEL DISPOSABLE) ×4 IMPLANT
TUBE FEEDING ENTERAL 5FR 16IN (TUBING) IMPLANT
UNDERPAD 30X36 HEAVY ABSORB (UNDERPADS AND DIAPERS) ×2 IMPLANT

## 2022-08-28 NOTE — Discharge Instructions (Signed)

## 2022-08-28 NOTE — H&P (Signed)
  Mike Lowe is an 56 y.o. male.   Chief Complaint: left index infection HPI: 56 yo male s/p incision and drainage left index finger by ED 2 weeks ago.  Has had continued drainage from finger with increased erythema over past few days.  XR shows bony changes to tuft.  He wishes to proceed with operative incision and drainage with debridement.  Allergies: No Known Allergies  History reviewed. No pertinent past medical history.  Past Surgical History:  Procedure Laterality Date   NO PAST SURGERIES      Family History: History reviewed. No pertinent family history.  Social History:   reports that he has never smoked. He does not have any smokeless tobacco history on file. No history on file for alcohol use and drug use.  Medications: Medications Prior to Admission  Medication Sig Dispense Refill   MULTIPLE VITAMIN PO Take by mouth.      No results found for this or any previous visit (from the past 48 hour(s)).  No results found.    Blood pressure (!) 170/99, pulse 79, temperature 98.1 F (36.7 C), temperature source Oral, resp. rate 18, SpO2 99 %.  General appearance: alert, cooperative, and appears stated age Head: Normocephalic, without obvious abnormality, atraumatic Neck: supple, symmetrical, trachea midline Extremities: Intact sensation and capillary refill all digits.  +epl/fpl/io.  Draining wound tip of left index finger. Pulses: 2+ and symmetric Skin: Skin color, texture, turgor normal. No rashes or lesions Neurologic: Grossly normal Incision/Wound: as above  Assessment/Plan Left index finger infection with osteomyelitis.  Plan incision and drainage with debridement of wound and curettage of bone.  Non operative and operative treatment options have been discussed with the patient and patient wishes to proceed with operative treatment. Risks, benefits, and alternatives of surgery have been discussed and the patient agrees with the plan of care.   Leanora Cover 08/28/2022, 12:40 PM

## 2022-08-28 NOTE — Op Note (Addendum)
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   ASSISTANT: none   ANESTHESIA:  Local   INTRAVENOUS FLUIDS:  Per anesthesia flow sheet.   ESTIMATED BLOOD LOSS:  Minimal.   COMPLICATIONS:  None.   SPECIMENS: Cultures to micro   TOURNIQUET TIME:    Total Tourniquet Time Documented: Forearm (Left) - 25 minutes Total: Forearm (Left) - 25 minutes    DISPOSITION:  Stable to PACU.   INDICATIONS: 56 year old male status post incision and drainage left index finger infection by the emergency department 2 weeks ago.  He had residual drainage from the wound.  Still with erythema of the pad of the finger.  Radiographs show a radiopaque foreign body.  He wishes to proceed with incision and drainage and debridement with curettage of distal phalanx tuft and removal of foreign body.  Risks, benefits and alternatives of surgery were discussed including the risks of blood loss, infection, damage to nerves, vessels, tendons, ligaments, bone for surgery, need for additional surgery, complications with wound healing, continued pain, stiffness, , need for repeat irrigation and debridement.  He voiced understanding of these risks and elected to proceed.  OPERATIVE COURSE:  After being identified preoperatively by myself,  the patient and I agreed on the procedure and site of the procedure.  The surgical site was marked.  Surgical consent had been signed. Antibiotics were held for intraoperative  cultures. He was transferred to the operating room and placed on the operating table in supine position with the Left upper extremity on an arm board.   A surgical pause was performed between the surgeons, anesthesia, and operating room staff and all were in agreement as to the patient, procedure, and site of procedure.  A digital block was performed with quarter percent plain Marcaine.  Left upper extremity was prepped and draped in normal sterile orthopedic fashion.  A surgical pause was performed between the surgeons, anesthesia, and operating room staff and all were in agreement as to the patient, procedure, and site of procedure.   A Penrose drain was used as a tourniquet was up for approximately 20 minutes.  The wound was explored.  There was mild amount of purulence.  Cultures were taken for aerobes and anaerobes.  There was a chunk of devitalized and purulent tissue at the distal end of the finger at the wound.  This was removed.  The distal phalanx tuft was visualized.  There was some loss of the volar aspect.  The house curette was used to debride the bone back to a solid edge.  The volar soft tissues were spread with the scissors.  The radiopaque foreign body was able to be visualized.  It was localized on C arm first.  A small rondure was used to remove the foreign body.  AP and lateral views of the finger showed removal of radiopaque foreign body.  The wound was copiously irrigated with sterile saline.  Was packed with quarter inch iodoform gauze.  Was then dressed with sterile 4 x 4 and wrapped with a  Coban dressing lightly.  AlumaFoam splint was placed and wrapped lightly with Coban dressing.  Penrose drain was removed.  The operative drapes were broken down.  He was transferred back to the stretcher.  I will see him back in the office in 3-4 days for postoperative followup.  I will give him a prescription for Tramadol 50 mg 1-2 tabs PO q6 hours prn pain, dispense # 20 and Bactrim DS 1 p.o. twice  daily x7 days.  We will extend this time culture results for coverage of osteomyelitis.  Leanora Cover, MD Electronically signed, 08/28/22

## 2022-08-29 ENCOUNTER — Encounter (HOSPITAL_BASED_OUTPATIENT_CLINIC_OR_DEPARTMENT_OTHER): Payer: Self-pay | Admitting: Orthopedic Surgery

## 2022-09-02 LAB — AEROBIC/ANAEROBIC CULTURE W GRAM STAIN (SURGICAL/DEEP WOUND): Gram Stain: NONE SEEN

## 2023-04-26 DIAGNOSIS — Z125 Encounter for screening for malignant neoplasm of prostate: Secondary | ICD-10-CM | POA: Diagnosis not present

## 2023-04-26 DIAGNOSIS — Z1159 Encounter for screening for other viral diseases: Secondary | ICD-10-CM | POA: Diagnosis not present

## 2023-04-26 DIAGNOSIS — M7022 Olecranon bursitis, left elbow: Secondary | ICD-10-CM | POA: Diagnosis not present

## 2023-04-26 DIAGNOSIS — Z13220 Encounter for screening for lipoid disorders: Secondary | ICD-10-CM | POA: Diagnosis not present

## 2023-04-26 DIAGNOSIS — R198 Other specified symptoms and signs involving the digestive system and abdomen: Secondary | ICD-10-CM | POA: Diagnosis not present

## 2023-04-26 DIAGNOSIS — Z8739 Personal history of other diseases of the musculoskeletal system and connective tissue: Secondary | ICD-10-CM | POA: Diagnosis not present

## 2023-05-02 DIAGNOSIS — M25522 Pain in left elbow: Secondary | ICD-10-CM | POA: Diagnosis not present

## 2023-05-02 DIAGNOSIS — M7022 Olecranon bursitis, left elbow: Secondary | ICD-10-CM | POA: Diagnosis not present

## 2023-05-07 DIAGNOSIS — M25522 Pain in left elbow: Secondary | ICD-10-CM | POA: Diagnosis not present

## 2023-05-14 DIAGNOSIS — M25522 Pain in left elbow: Secondary | ICD-10-CM | POA: Diagnosis not present

## 2023-05-28 DIAGNOSIS — M25522 Pain in left elbow: Secondary | ICD-10-CM | POA: Diagnosis not present

## 2023-06-04 DIAGNOSIS — M25522 Pain in left elbow: Secondary | ICD-10-CM | POA: Diagnosis not present

## 2023-06-11 DIAGNOSIS — M25522 Pain in left elbow: Secondary | ICD-10-CM | POA: Diagnosis not present

## 2024-05-01 DIAGNOSIS — Z0189 Encounter for other specified special examinations: Secondary | ICD-10-CM | POA: Diagnosis not present

## 2024-05-01 DIAGNOSIS — Z130 Encounter for screening for diseases of the blood and blood-forming organs and certain disorders involving the immune mechanism: Secondary | ICD-10-CM | POA: Diagnosis not present

## 2024-05-01 DIAGNOSIS — R7309 Other abnormal glucose: Secondary | ICD-10-CM | POA: Diagnosis not present

## 2024-05-01 DIAGNOSIS — Z125 Encounter for screening for malignant neoplasm of prostate: Secondary | ICD-10-CM | POA: Diagnosis not present

## 2024-05-01 DIAGNOSIS — Z1211 Encounter for screening for malignant neoplasm of colon: Secondary | ICD-10-CM | POA: Diagnosis not present

## 2024-05-01 DIAGNOSIS — E785 Hyperlipidemia, unspecified: Secondary | ICD-10-CM | POA: Diagnosis not present
